# Patient Record
Sex: Male | Born: 1990 | Race: Black or African American | Hispanic: No | Marital: Single | Smoking: Former smoker
Health system: Southern US, Community
[De-identification: ages and names within clinical notes are randomized; demographics above are authoritative.]

## PROBLEM LIST (undated history)

## (undated) DIAGNOSIS — Z789 Other specified health status: Secondary | ICD-10-CM

## (undated) DIAGNOSIS — R002 Palpitations: Secondary | ICD-10-CM

## (undated) HISTORY — DX: Other specified health status: Z78.9

## (undated) HISTORY — DX: Palpitations: R00.2

## (undated) HISTORY — PX: NO PAST SURGERIES: SHX2092

---

## 2020-07-02 ENCOUNTER — Encounter (HOSPITAL_COMMUNITY): Payer: Self-pay

## 2020-07-02 ENCOUNTER — Emergency Department (HOSPITAL_COMMUNITY)
Admission: EM | Admit: 2020-07-02 | Discharge: 2020-07-02 | Disposition: A | Discharge status: Home / Self Care | Payer: Self-pay | Attending: Emergency Medicine | Admitting: Emergency Medicine

## 2020-07-02 ENCOUNTER — Other Ambulatory Visit: Payer: Self-pay

## 2020-07-02 DIAGNOSIS — M5459 Other low back pain: Secondary | ICD-10-CM | POA: Insufficient documentation

## 2020-07-02 DIAGNOSIS — Z87891 Personal history of nicotine dependence: Secondary | ICD-10-CM | POA: Insufficient documentation

## 2020-07-02 DIAGNOSIS — M545 Low back pain, unspecified: Secondary | ICD-10-CM

## 2020-07-02 LAB — URINALYSIS, ROUTINE W REFLEX MICROSCOPIC
Bilirubin Urine: NEGATIVE
Glucose, UA: NEGATIVE mg/dL
Hgb urine dipstick: NEGATIVE
Ketones, ur: NEGATIVE mg/dL
Leukocytes,Ua: NEGATIVE
Nitrite: NEGATIVE
Protein, ur: NEGATIVE mg/dL
Specific Gravity, Urine: 1.002 — ABNORMAL LOW (ref 1.005–1.030)
pH: 6 (ref 5.0–8.0)

## 2020-07-02 MED ORDER — METHOCARBAMOL 500 MG PO TABS: 500.0000 mg | ORAL_TABLET | Freq: Two times a day (BID) | ORAL | 0 refills | Status: DC

## 2020-07-02 MED ORDER — NAPROXEN 250 MG PO TABS
500.0000 mg | ORAL_TABLET | Freq: Once | ORAL | Status: AC
  Administered 2020-07-02: 500 mg via ORAL
  Filled 2020-07-02: qty 2

## 2020-07-02 MED ORDER — LIDOCAINE 5 % EX PTCH
1.0000 | MEDICATED_PATCH | CUTANEOUS | Status: DC
  Administered 2020-07-02: 1 via TRANSDERMAL
  Filled 2020-07-02: qty 1

## 2020-07-02 MED ORDER — NAPROXEN 500 MG PO TABS: 500.0000 mg | ORAL_TABLET | Freq: Two times a day (BID) | ORAL | 0 refills | Status: DC

## 2020-07-02 NOTE — ED Triage Notes (Signed)
Pt arrives via POV c/o on-going back pain on the lower right side that recently has gotten worse. Pt reports thinking he might have a kidney pain but can't put a clear description on the pain.

## 2020-07-02 NOTE — Discharge Instructions (Addendum)
As discussed, your low back pain is likely a muscle strain.  I am sending home with a pain medication and muscle relaxer.  Muscle relaxer can cause drowsiness do not drive or operate machinery while on the medication.  I have also included low back exercises.  You may also purchase over-the-counter Lidoderm patches and Voltaren gel for added pain relief.  Return to the ER for new or worsening symptoms.

## 2020-07-02 NOTE — ED Provider Notes (Signed)
[facility] EMERGENCY DEPARTMENT Provider Note   CSN: [id number] Arrival date & time: 07/02/20  2008     History Chief Complaint  Patient presents with  . Back Pain    Franklin Avila is a 30 y.o. male with no significant past medical history presents to the ED due to worsening right lower back pain that has been intermittent in nature for numerous years.  Most recent episode started earlier this morning.  Patient states he has been exercising more than normal.  Denies injury to low back.  Pain is worse with movement and palpation.  Pain does not radiate down right lower extremity.  Denies saddle paresthesias, bowel/bladder incontinence, lower extremity numbness/tingling, lower extremity weakness, fever, history of IV drug use, history of cancer.  Denies associated abdominal pain, urinary symptoms, and rash.  He took an ibuprofen prior to arrival with resolution in symptoms.  History obtained from patient and past medical records. No interpreter used during encounter.      History reviewed. No pertinent past medical history.  There are no problems to display for this patient.   History reviewed. No pertinent surgical history.     Family History  Problem Relation Age of Onset  . Asthma Mother     Social History   Tobacco Use  . Smoking status: Former Smoker    Years: 2.00  . Smokeless tobacco: Never Used  Vaping Use  . Vaping Use: Some days  Substance Use Topics  . Alcohol use: Yes  . Drug use: Not Currently    Types: Marijuana    Home Medications Prior to Admission medications   Medication Sig Start Date End Date Taking? Authorizing Provider  methocarbamol (ROBAXIN) 500 MG tablet Take 1 tablet (500 mg total) by mouth 2 (two) times daily. 07/02/20  Yes Aberman, Caroline C, PA-C  naproxen (NAPROSYN) 500 MG tablet Take 1 tablet (500 mg total) by mouth 2 (two) times daily. 07/02/20  Yes Mannie Stabile, PA-C    Allergies    Copper  Review of Systems   Review of  Systems  Constitutional: Negative for fever.  Gastrointestinal: Negative for abdominal pain.  Genitourinary: Negative for difficulty urinating and dysuria.  Musculoskeletal: Positive for back pain. Negative for gait problem.  All other systems reviewed and are negative.   Physical Exam Updated Vital Signs BP 123/76 (BP Location: Right Arm)   Pulse 77   Temp 98.1 F (36.7 C) (Oral)   Resp 16   Ht 5\' 7"  (1.702 m)   Wt 65.8 kg   SpO2 99%   BMI 22.71 kg/m   Physical Exam Vitals and nursing note reviewed.  Constitutional:      General: He is not in acute distress.    Appearance: He is not ill-appearing.  HENT:     Head: Normocephalic.  Eyes:     Pupils: Pupils are equal, round, and reactive to light.  Cardiovascular:     Rate and Rhythm: Normal rate and regular rhythm.     Pulses: Normal pulses.     Heart sounds: Normal heart sounds. No murmur heard. No friction rub. No gallop.   Pulmonary:     Effort: Pulmonary effort is normal.     Breath sounds: Normal breath sounds.  Abdominal:     General: Abdomen is flat. Bowel sounds are normal. There is no distension.     Palpations: Abdomen is soft.     Tenderness: There is no abdominal tenderness. There is no guarding or rebound.  Comments: Abdomen soft, nondistended, nontender to palpation in all quadrants without guarding or peritoneal signs. No rebound.   Musculoskeletal:     Cervical back: Neck supple.     Comments: No T-spine and L-spine midline tenderness, no stepoff or deformity, no paraspinal tenderness. No leg edema bilaterally Patient moves all extremities without difficulty. DP/PT pulses 2+ and equal bilaterally Sensation grossly intact bilaterally Strength of knee flexion and extension is 5/5 Plantar and dorsiflexion of ankle 5/5 Able to ambulate without difficulty  Skin:    Comments: No overlying rash  Neurological:     General: No focal deficit present.     Mental Status: He is alert.  Psychiatric:         Mood and Affect: Mood normal.        Behavior: Behavior normal.     ED Results / Procedures / Treatments   Labs (all labs ordered are listed, but only abnormal results are displayed) Labs Reviewed  URINALYSIS, ROUTINE W REFLEX MICROSCOPIC    EKG None  Radiology No results found.  Procedures Procedures   Medications Ordered in ED Medications  lidocaine (LIDODERM) 5 % 1 patch (has no administration in time range)  naproxen (NAPROSYN) tablet 500 mg (has no administration in time range)    ED Course  I have reviewed the triage vital signs and the nursing notes.  Pertinent labs & imaging results that were available during my care of the patient were reviewed by me and considered in my medical decision making (see chart for details).    MDM Rules/Calculators/A&P                         30 year old male presents to the ED due to right-sided lumbar back pain that has been intermittent in nature for numerous years.  Most recent episode started earlier today.  Denies direct injury.  Denies saddle paresthesias, bowel/bladder incontinence, lower extremity numbness/tingling, lower extremity weakness, fever, IV drug use, and history of cancer.  Vitals all within normal limits.  Patient is afebrile, not tachycardic or hypoxic.  Patient in no acute distress and non-ill-appearing.  No thoracic or lumbar midline tenderness.  No overlying rash to suggest shingles.  Patient able to ambulate in the ED without difficulty.  Abdomen soft, nondistended, nontender. Patient requesting urine sample to rule out infection and to check renal function. Low suspicion for renal etiology given location of pain.  Naproxen and Lidoderm patch provided for symptomatic relief.  Broad differential for back pain considered includes malignancy, disc herniation, spinal epidural abscess, spinal fracture, cauda equina, pyelonephritis, kidney stone, AAA, AD, pancreatitis, PE and PTX.   History without red flags (cancer,  IVDU, weakness, saddle anesthesia, trauma, weight loss) and physical exam most consistent with muscular strain. Doubt cauda equina or disc herniation due to lack of saddle anesthesia/bowel or bladder incontinence or urinary retention, normal gait and reassuring physical examination without neurologic deficits.  UA negative for hematuria or signs of infection. History is not supportive of kidney stone, AAA, AD, pancreatitis, PE or PTX. Patient has no CVA tenderness or urinary symptoms to suggest pyelonephritis or kidney stone.   Will manage patient conservatively at this time. NSAIDs, back exercises/stretches, heat therapy and follow up with PCP if symptoms do not resolve in 3-4 weeks. Patient offered muscle relaxer for comfort at night. Counseled on need to return to ED for fever, worsening or concerning symptoms. Strict ED precautions discussed with patient. Patient states understanding and agrees to plan.  Patient discharged home in no acute distress and stable vitals. Final Clinical Impression(s) / ED Diagnoses Final diagnoses:  Acute right-sided low back pain without sciatica    Rx / DC Orders ED Discharge Orders         Ordered    naproxen (NAPROSYN) 500 MG tablet  2 times daily        07/02/20 2045    methocarbamol (ROBAXIN) 500 MG tablet  2 times daily        07/02/20 2045           Jesusita Oka 07/02/20 2143    Cheryll Cockayne, MD 07/03/20 [phone]

## 2020-07-22 ENCOUNTER — Other Ambulatory Visit: Payer: Self-pay

## 2020-07-22 ENCOUNTER — Emergency Department (HOSPITAL_COMMUNITY): Payer: Medicaid - Out of State

## 2020-07-22 ENCOUNTER — Emergency Department (HOSPITAL_COMMUNITY)
Admission: EM | Admit: 2020-07-22 | Discharge: 2020-07-22 | Disposition: A | Discharge status: Home / Self Care | Payer: Medicaid - Out of State | Attending: Emergency Medicine | Admitting: Emergency Medicine

## 2020-07-22 ENCOUNTER — Encounter (HOSPITAL_COMMUNITY): Payer: Self-pay | Admitting: *Deleted

## 2020-07-22 DIAGNOSIS — S29012A Strain of muscle and tendon of back wall of thorax, initial encounter: Secondary | ICD-10-CM

## 2020-07-22 DIAGNOSIS — M546 Pain in thoracic spine: Secondary | ICD-10-CM | POA: Diagnosis present

## 2020-07-22 DIAGNOSIS — R059 Cough, unspecified: Secondary | ICD-10-CM | POA: Diagnosis not present

## 2020-07-22 DIAGNOSIS — M62830 Muscle spasm of back: Secondary | ICD-10-CM | POA: Insufficient documentation

## 2020-07-22 DIAGNOSIS — Z87891 Personal history of nicotine dependence: Secondary | ICD-10-CM | POA: Diagnosis not present

## 2020-07-22 NOTE — ED Triage Notes (Signed)
States he has pain in mid back and when he reads up on pain he has become concerned. He is here for further evaluation. Advised of wait.

## 2020-07-22 NOTE — Discharge Instructions (Addendum)
YOur xray showed no abnormalities   Get help right away if you: Have shortness of breath. Have chest pain. Develop numbness or weakness in your legs or arms. Have involuntary loss of urine (urinary incontinence).

## 2020-07-22 NOTE — ED Provider Notes (Signed)
[facility] EMERGENCY DEPARTMENT Provider Note   CSN: [id number] Arrival date & time: 07/22/20  1309     History Chief Complaint  Patient presents with  . Back Pain    Franklin Avila is a 30 y.o. male. Who presents emergency department chief complaint of mid back pain.  Patient states that has been going on for several days.  Worse when he raises his left arm.  He has had increased exercise routine.  Patient states that he used to drink and smoke but recently quit and is afraid that there may be something wrong inside of his chest.  He has a slight cough.  He denies fevers chills weight loss soaking night sweats. HPI     History reviewed. No pertinent past medical history.  There are no problems to display for this patient.   History reviewed. No pertinent surgical history.     Family History  Problem Relation Age of Onset  . Asthma Mother     Social History   Tobacco Use  . Smoking status: Former Smoker    Years: 2.00  . Smokeless tobacco: Never Used  Vaping Use  . Vaping Use: Some days  Substance Use Topics  . Alcohol use: Yes  . Drug use: Not Currently    Types: Marijuana    Home Medications Prior to Admission medications   Medication Sig Start Date End Date Taking? Authorizing Provider  methocarbamol (ROBAXIN) 500 MG tablet Take 1 tablet (500 mg total) by mouth 2 (two) times daily. 07/02/20   Mannie Stabile, PA-C  naproxen (NAPROSYN) 500 MG tablet Take 1 tablet (500 mg total) by mouth 2 (two) times daily. 07/02/20   Mannie Stabile, PA-C    Allergies    Copper  Review of Systems   Review of Systems Ten systems reviewed and are negative for acute change, except as noted in the HPI.   Physical Exam Updated Vital Signs BP 123/75   Pulse 66   Temp 97.9 F (36.6 C) (Oral)   Resp 14   SpO2 100%   Physical Exam Vitals and nursing note reviewed.  Constitutional:      General: He is not in acute distress.    Appearance: He is well-developed  and well-nourished. He is not diaphoretic.  HENT:     Head: Normocephalic and atraumatic.  Eyes:     General: No scleral icterus.    Conjunctiva/sclera: Conjunctivae normal.  Cardiovascular:     Rate and Rhythm: Normal rate and regular rhythm.     Heart sounds: Normal heart sounds.  Pulmonary:     Effort: Pulmonary effort is normal. No respiratory distress.     Breath sounds: Normal breath sounds.  Abdominal:     Palpations: Abdomen is soft.     Tenderness: There is no abdominal tenderness.  Musculoskeletal:        General: No edema.     Cervical back: Normal range of motion and neck supple.     Thoracic back: Spasms and tenderness present.       Back:  Skin:    General: Skin is warm and dry.  Neurological:     Mental Status: He is alert.  Psychiatric:        Behavior: Behavior normal.     ED Results / Procedures / Treatments   Labs (all labs ordered are listed, but only abnormal results are displayed) Labs Reviewed - No data to display  EKG None  Radiology No results found.  Procedures Procedures  Medications Ordered in ED Medications - No data to display  ED Course  I have reviewed the triage vital signs and the nursing notes.  Pertinent labs & imaging results that were available during my care of the patient were reviewed by me and considered in my medical decision making (see chart for details).    MDM Rules/Calculators/A&P                          I reviewed patient's chest x-ray which shows no abnormalities.  Patient appears to have muscular spasm in the lower trapezius fibers.  Patient be discharged with supportive care and over-the-counter pain medications.  Appears otherwise appropriate for discharge at this time.  Discussed return precautions Final Clinical Impression(s) / ED Diagnoses Final diagnoses:  None    Rx / DC Orders ED Discharge Orders    None       Arthor Captain, PA-C 07/22/20 2305    Maia Plan, MD 07/25/20 1023

## 2020-07-22 NOTE — ED Notes (Addendum)
Entered room and introduced self to patient. Pt appears to be resting in bed, respirations are even and unlabored with equal chest rise and fall. Bed is locked in the lowest position, side rails x2, call bell within reach. Pt educated on call light use and hourly rounding, verbalized understanding and in agreement at this time. All questions and concerns voiced addressed. Refreshments offered and provided per patient request.  

## 2020-07-22 NOTE — ED Triage Notes (Signed)
Back pain for over a month

## 2020-07-22 NOTE — ED Notes (Signed)
ED Provider at bedside. 

## 2020-10-10 ENCOUNTER — Other Ambulatory Visit: Payer: Self-pay

## 2020-10-10 ENCOUNTER — Encounter (HOSPITAL_COMMUNITY): Payer: Self-pay | Admitting: Emergency Medicine

## 2020-10-10 DIAGNOSIS — M5442 Lumbago with sciatica, left side: Secondary | ICD-10-CM | POA: Insufficient documentation

## 2020-10-10 DIAGNOSIS — M5441 Lumbago with sciatica, right side: Secondary | ICD-10-CM | POA: Insufficient documentation

## 2020-10-10 DIAGNOSIS — Z87891 Personal history of nicotine dependence: Secondary | ICD-10-CM | POA: Insufficient documentation

## 2020-10-10 DIAGNOSIS — R1110 Vomiting, unspecified: Secondary | ICD-10-CM | POA: Diagnosis not present

## 2020-10-10 DIAGNOSIS — M545 Low back pain, unspecified: Secondary | ICD-10-CM | POA: Diagnosis present

## 2020-10-10 NOTE — ED Triage Notes (Signed)
Pt c/o body aches and vomiting that started this am. Pt states he drank a lot of beer last night.

## 2020-10-11 ENCOUNTER — Emergency Department (HOSPITAL_COMMUNITY)
Admission: EM | Admit: 2020-10-11 | Discharge: 2020-10-11 | Disposition: A | Discharge status: Home / Self Care | Payer: Medicaid - Out of State | Attending: Emergency Medicine | Admitting: Emergency Medicine

## 2020-10-11 DIAGNOSIS — M5442 Lumbago with sciatica, left side: Secondary | ICD-10-CM

## 2020-10-11 DIAGNOSIS — M5441 Lumbago with sciatica, right side: Secondary | ICD-10-CM

## 2020-10-11 LAB — COMPREHENSIVE METABOLIC PANEL
ALT: 31 U/L (ref 0–44)
AST: 34 U/L (ref 15–41)
Albumin: 4.3 g/dL (ref 3.5–5.0)
Alkaline Phosphatase: 63 U/L (ref 38–126)
Anion gap: 10 (ref 5–15)
BUN: 10 mg/dL (ref 6–20)
CO2: 25 mmol/L (ref 22–32)
Calcium: 8.9 mg/dL (ref 8.9–10.3)
Chloride: 104 mmol/L (ref 98–111)
Creatinine, Ser: 1.01 mg/dL (ref 0.61–1.24)
GFR, Estimated: >60 mL/min (ref >60)
Glucose, Bld: 109 mg/dL — ABNORMAL HIGH (ref 70–99)
Potassium: 3.8 mmol/L (ref 3.5–5.1)
Sodium: 139 mmol/L (ref 135–145)
Total Bilirubin: 0.4 mg/dL (ref 0.3–1.2)
Total Protein: 7.4 g/dL (ref 6.5–8.1)

## 2020-10-11 LAB — CBC
HCT: 44.7 % (ref 39.0–52.0)
Hemoglobin: 14.5 g/dL (ref 13.0–17.0)
MCH: 29.1 pg (ref 26.0–34.0)
MCHC: 32.4 g/dL (ref 30.0–36.0)
MCV: 89.8 fL (ref 80.0–100.0)
Platelets: 178 10*3/uL (ref 150–400)
RBC: 4.98 MIL/uL (ref 4.22–5.81)
RDW: 14 % (ref 11.5–15.5)
WBC: 6.1 10*3/uL (ref 4.0–10.5)
nRBC: 0 % (ref 0.0–0.2)

## 2020-10-11 LAB — LIPASE, BLOOD: Lipase: 30 U/L (ref 11–51)

## 2020-10-11 MED ORDER — METHOCARBAMOL 1000 MG/10ML IJ SOLN
INTRAMUSCULAR | Status: AC
  Filled 2020-10-11: qty 10

## 2020-10-11 MED ORDER — KETOROLAC TROMETHAMINE 30 MG/ML IJ SOLN
30.0000 mg | Freq: Once | INTRAMUSCULAR | Status: AC
  Administered 2020-10-11: 30 mg via INTRAVENOUS
  Filled 2020-10-11: qty 1

## 2020-10-11 MED ORDER — METHOCARBAMOL 500 MG PO TABS: 500.0000 mg | ORAL_TABLET | Freq: Four times a day (QID) | ORAL | 0 refills | Status: DC | PRN

## 2020-10-11 MED ORDER — NAPROXEN 500 MG PO TABS: 500.0000 mg | ORAL_TABLET | Freq: Two times a day (BID) | ORAL | 0 refills | Status: DC

## 2020-10-11 MED ORDER — METHOCARBAMOL 1000 MG/10ML IJ SOLN
1000.0000 mg | Freq: Once | INTRAVENOUS | Status: AC
  Administered 2020-10-11: 1000 mg via INTRAVENOUS
  Filled 2020-10-11: qty 10

## 2020-10-11 MED ORDER — ACETAMINOPHEN 325 MG PO TABS
650.0000 mg | ORAL_TABLET | Freq: Once | ORAL | Status: AC
  Administered 2020-10-11: 650 mg via ORAL
  Filled 2020-10-11: qty 2

## 2020-10-11 NOTE — ED Provider Notes (Signed)
[facility] EMERGENCY DEPARTMENT Provider Note   CSN: [id number] Arrival date & time: 10/10/20  2324     History Chief Complaint  Patient presents with  . Emesis    Franklin Avila is a 30 y.o. male.  The history is provided by the patient.  Emesis 's complaint is actually low back pain with radiation to both legs.  He had been drinking last night and woke up this morning with nausea and vomiting.  He vomited once and felt better.  He denies nausea currently.  However, since then, he has had pain across his lower back radiating down both legs.  He rates pain at 9/10.  He has been unable to get comfortable.  He has taken ibuprofen without any relief.  He denies any weakness, numbness, tingling.  Denies any bowel or bladder dysfunction.  He has had back pain in the past.   History reviewed. No pertinent past medical history.  There are no problems to display for this patient.   History reviewed. No pertinent surgical history.     Family History  Problem Relation Age of Onset  . Asthma Mother     Social History   Tobacco Use  . Smoking status: Former Smoker    Years: 2.00  . Smokeless tobacco: Never Used  Vaping Use  . Vaping Use: Some days  Substance Use Topics  . Alcohol use: Yes  . Drug use: Yes    Home Medications Prior to Admission medications   Medication Sig Start Date End Date Taking? Authorizing Provider  methocarbamol (ROBAXIN) 500 MG tablet Take 1 tablet (500 mg total) by mouth 2 (two) times daily. 07/02/20   Mannie Stabile, PA-C  naproxen (NAPROSYN) 500 MG tablet Take 1 tablet (500 mg total) by mouth 2 (two) times daily. 07/02/20   Mannie Stabile, PA-C    Allergies    Copper  Review of Systems   Review of Systems  Gastrointestinal: Positive for vomiting.  All other systems reviewed and are negative.   Physical Exam Updated Vital Signs BP 117/72 (BP Location: Right Arm)   Pulse 96   Temp 99 F (37.2 C) (Oral)   Resp 17   Ht 5\' 6"   (1.676 m)   Wt 65.8 kg   SpO2 100%   BMI 23.40 kg/m   Physical Exam Vitals and nursing note reviewed.   30 year old male, resting comfortably and in no acute distress. Vital signs are normal. Oxygen saturation is 100%, which is normal. Head is normocephalic and atraumatic. PERRLA, EOMI. Oropharynx is clear. Neck is nontender and supple without adenopathy or JVD. Back: There is no midline tenderness.  Moderate bilateral paralumbar spasm is present.  Straight leg raise is positive at 60 degrees bilaterally.  There is no CVA tenderness. Lungs are clear without rales, wheezes, or rhonchi. Chest is nontender. Heart has regular rate and rhythm without murmur. Abdomen is soft, flat, nontender without masses or hepatosplenomegaly and peristalsis is normoactive. Extremities have no cyanosis or edema, full range of motion is present. Skin is warm and dry without rash. Neurologic: Mental status is normal, cranial nerves are intact, there are no motor or sensory deficits.  ED Results / Procedures / Treatments   Labs (all labs ordered are listed, but only abnormal results are displayed) Labs Reviewed  COMPREHENSIVE METABOLIC PANEL - Abnormal; Notable for the following components:      Result Value   Glucose, Bld 109 (*)    All other components within normal limits  LIPASE, BLOOD  CBC  URINALYSIS, ROUTINE W REFLEX MICROSCOPIC   Procedures Procedures   Medications Ordered in ED Medications  ketorolac (TORADOL) 30 MG/ML injection 30 mg (30 mg Intravenous Given 10/11/20 0250)  methocarbamol (ROBAXIN) 1,000 mg in dextrose 5 % 100 mL IVPB (0 mg Intravenous Stopped 10/11/20 0324)  acetaminophen (TYLENOL) tablet 650 mg (650 mg Oral Given 10/11/20 0250)    ED Course  I have reviewed the triage vital signs and the nursing notes.  Pertinent lab results that were available during my care of the patient were reviewed by me and considered in my medical decision making (see chart for details).   MDM  Rules/Calculators/A&P                         Low back pain with bilateral sciatica.  No evidence of any neurologic compromise.  No indication for imaging today.  Old records are reviewed, and he does have a prior ED visit for upper back pain.  We will give him a dose of ketorolac and methocarbamol and reassess.  Labs are unremarkable.  He feels much better after above-noted treatment.  He is discharged with prescriptions for naproxen and methocarbamol, told to add acetaminophen as needed for additional pain relief.  Final Clinical Impression(s) / ED Diagnoses Final diagnoses:  Acute bilateral low back pain with bilateral sciatica    Rx / DC Orders ED Discharge Orders         Ordered    methocarbamol (ROBAXIN) 500 MG tablet  Every 6 hours PRN        10/11/20 0352    naproxen (NAPROSYN) 500 MG tablet  2 times daily        10/11/20 0352           Dione Booze, MD 10/11/20 [phone]

## 2020-10-11 NOTE — Discharge Instructions (Addendum)
Apply ice to your lower back as needed.  Ice should be applied for 30 minutes at a time, as often as 4 times a day.  You may take acetaminophen (Tylenol) as needed for additional pain relief.

## 2022-04-10 IMAGING — DX DG CHEST 2V
2 series · 2 of 2 positions shown · non-contrast
Comparison: None.

CLINICAL DATA: Cough and mid back pain for 1 month

EXAM:
CHEST - 2 VIEW

[chest pa]
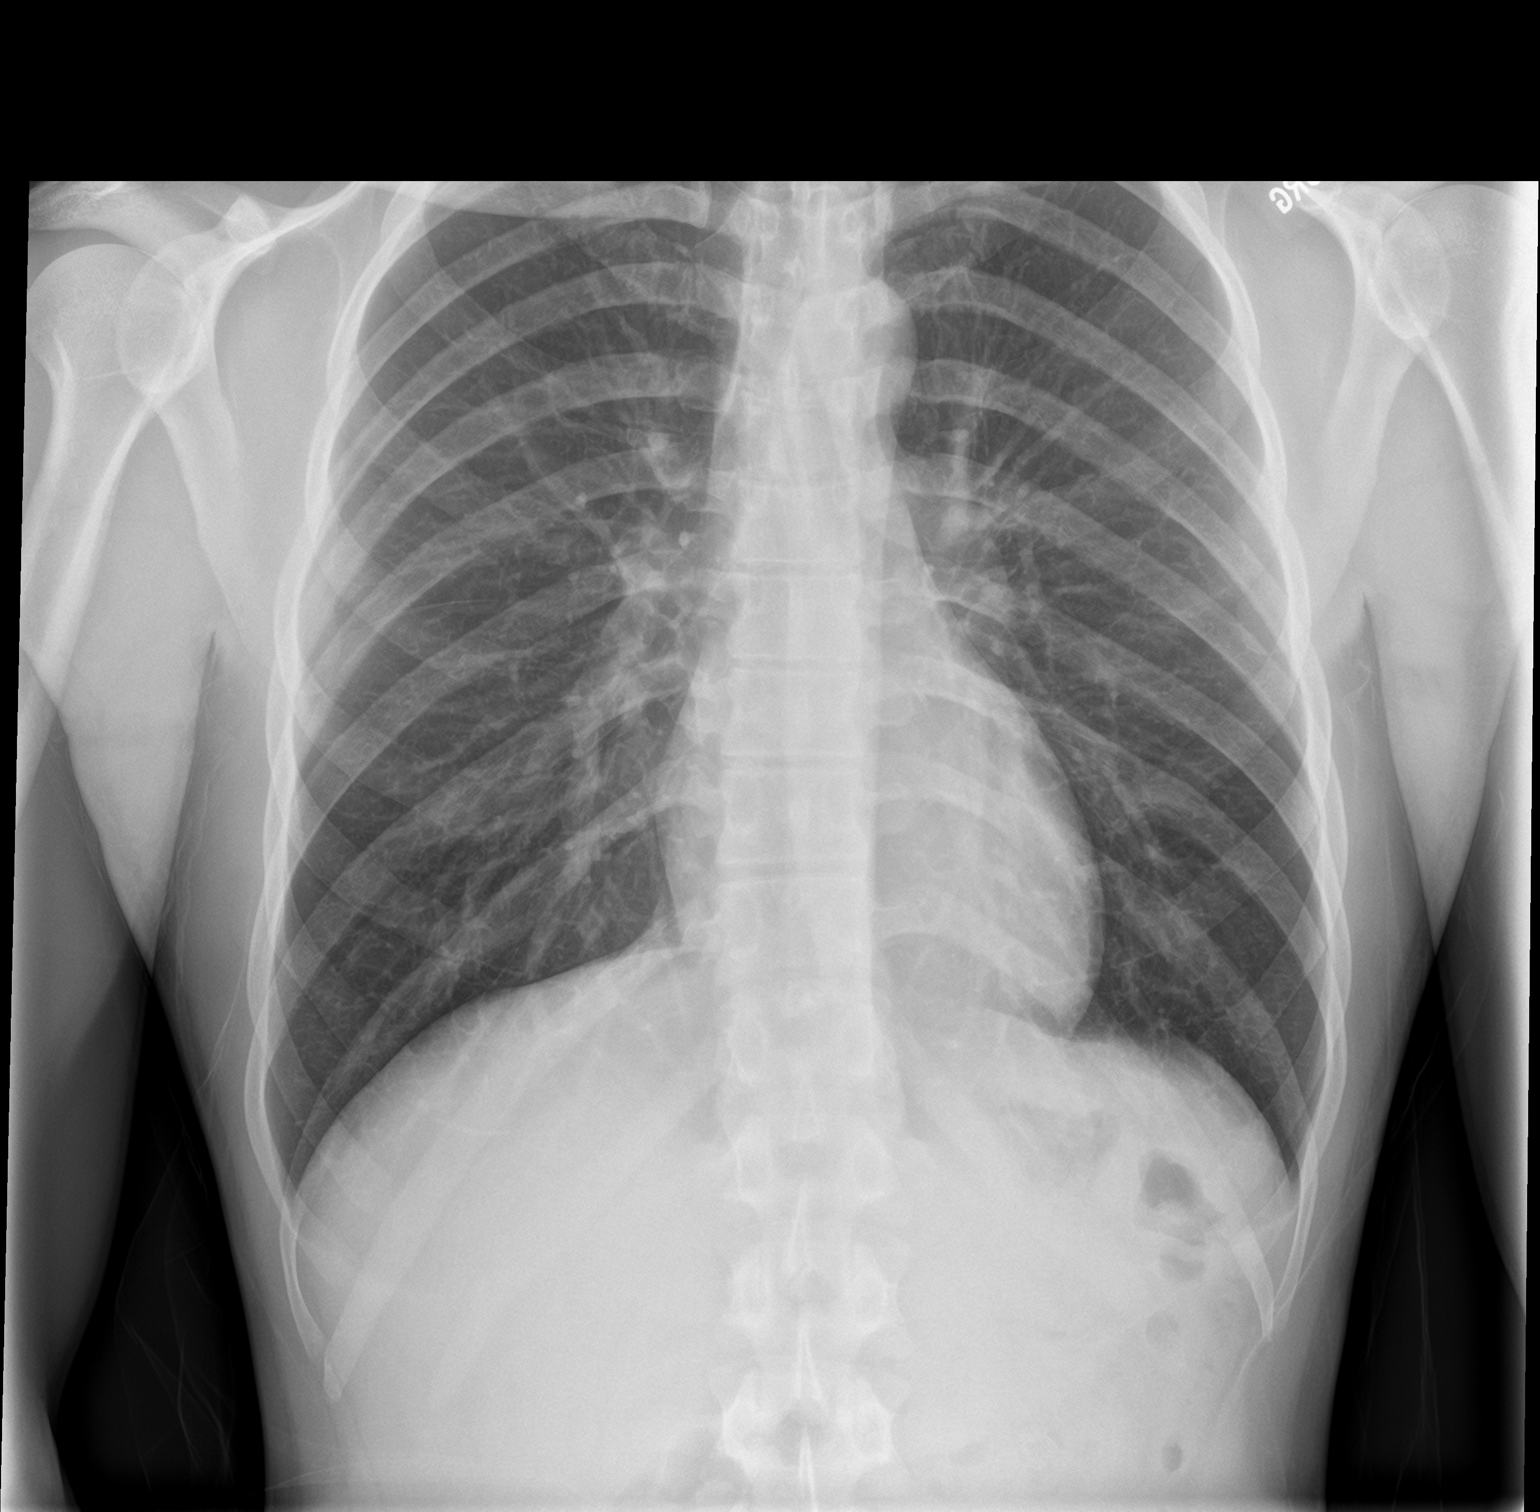

[chest lat]
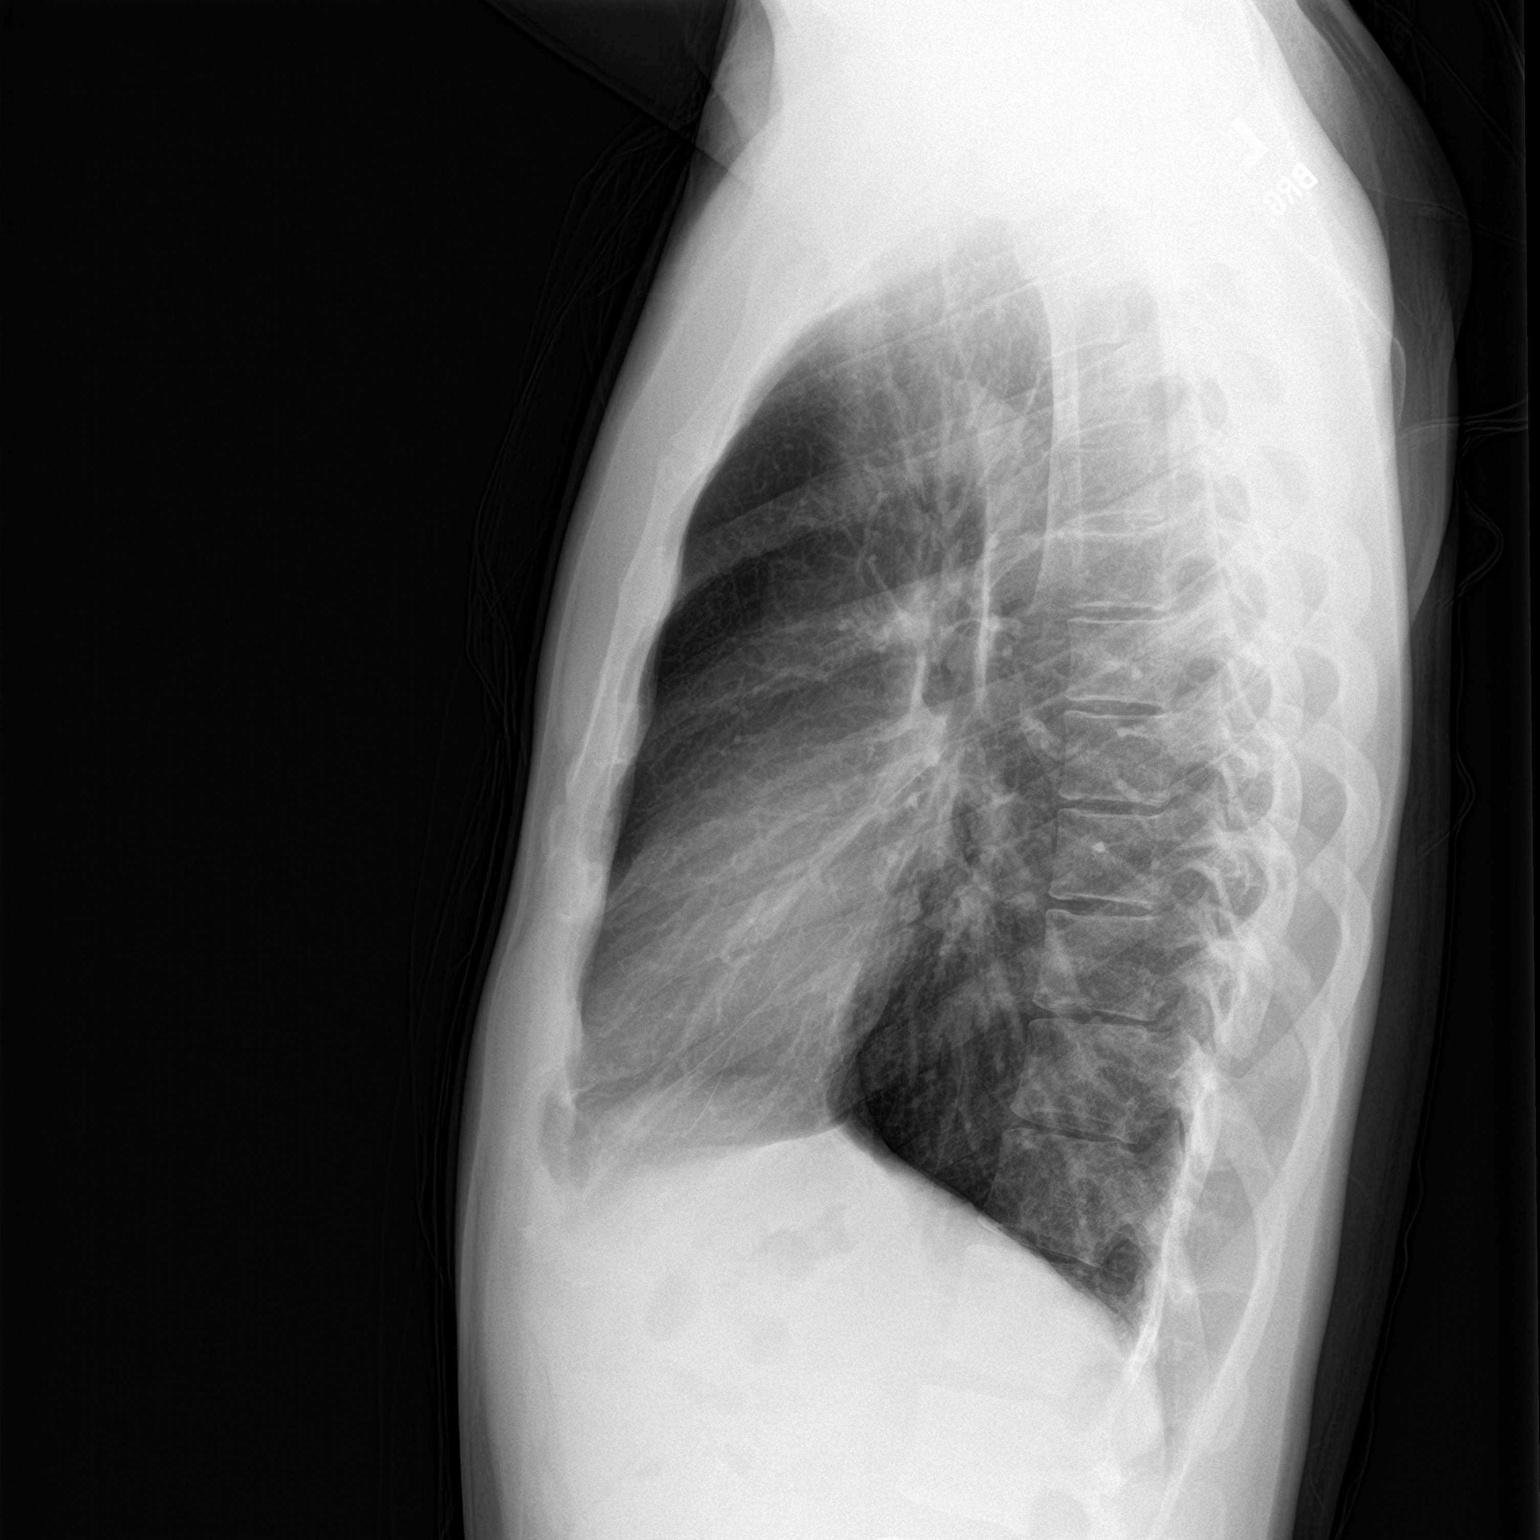

[2 of 2 positions shown; findings below may reference images not displayed]

FINDINGS: Normal heart size. Normal mediastinal contour. No pneumothorax. No
pleural effusion. Lungs appear clear, with no acute consolidative
airspace disease and no pulmonary edema.
IMPRESSION: No active cardiopulmonary disease.

## 2022-12-07 ENCOUNTER — Encounter (HOSPITAL_COMMUNITY): Payer: Self-pay | Admitting: Emergency Medicine

## 2022-12-07 ENCOUNTER — Other Ambulatory Visit: Payer: Self-pay

## 2022-12-07 ENCOUNTER — Emergency Department (HOSPITAL_COMMUNITY)
Admission: EM | Admit: 2022-12-07 | Discharge: 2022-12-08 | Disposition: A | Discharge status: Home / Self Care | Payer: Medicaid Other | Attending: Emergency Medicine | Admitting: Emergency Medicine

## 2022-12-07 ENCOUNTER — Emergency Department (HOSPITAL_COMMUNITY): Payer: Medicaid Other

## 2022-12-07 DIAGNOSIS — R0789 Other chest pain: Secondary | ICD-10-CM | POA: Diagnosis present

## 2022-12-07 DIAGNOSIS — F17200 Nicotine dependence, unspecified, uncomplicated: Secondary | ICD-10-CM | POA: Diagnosis not present

## 2022-12-07 LAB — CBC
HCT: 44.8 % (ref 39.0–52.0)
Hemoglobin: 14.9 g/dL (ref 13.0–17.0)
MCH: 29.3 pg (ref 26.0–34.0)
MCHC: 33.3 g/dL (ref 30.0–36.0)
MCV: 88 fL (ref 80.0–100.0)
Platelets: 218 10*3/uL (ref 150–400)
RBC: 5.09 MIL/uL (ref 4.22–5.81)
RDW: 13 % (ref 11.5–15.5)
WBC: 5.9 10*3/uL (ref 4.0–10.5)
nRBC: 0 % (ref 0.0–0.2)

## 2022-12-07 NOTE — ED Triage Notes (Signed)
Pt c/o slight chest soreness for the past 2 days. Pt also c/o slight shortness of breath this morning when he woke up, pt states he took some CBD oil and the shortness of breath improved.

## 2022-12-08 LAB — BASIC METABOLIC PANEL
Anion gap: 7 (ref 5–15)
BUN: 11 mg/dL (ref 6–20)
CO2: 25 mmol/L (ref 22–32)
Calcium: 8.9 mg/dL (ref 8.9–10.3)
Chloride: 103 mmol/L (ref 98–111)
Creatinine, Ser: 0.87 mg/dL (ref 0.61–1.24)
GFR, Estimated: >60 mL/min (ref >60)
Glucose, Bld: 94 mg/dL (ref 70–99)
Potassium: 3.9 mmol/L (ref 3.5–5.1)
Sodium: 135 mmol/L (ref 135–145)

## 2022-12-08 LAB — TROPONIN I (HIGH SENSITIVITY): Troponin I (High Sensitivity): 3 ng/L (ref <18)

## 2022-12-08 MED ORDER — IBUPROFEN 800 MG PO TABS
800.0000 mg | ORAL_TABLET | Freq: Once | ORAL | Status: AC
  Administered 2022-12-08: 800 mg via ORAL
  Filled 2022-12-08: qty 1

## 2022-12-08 NOTE — ED Provider Notes (Signed)
[place] EMERGENCY DEPARTMENT AT [facility] Provider Note   CSN: [id number] Arrival date & time: 12/07/22  2248     History  Chief Complaint  Patient presents with   Chest Pain    Franklin Avila is a 32 y.o. male.  Patient is a 33 year old male with no significant past medical history.  Patient presenting today with complaints of chest discomfort.  This is been ongoing for the past 2 days.  He describes a sharp pain to the lower front of his chest and that is worse when he sits forward and turns over.  He denies any shortness of breath, nausea, diaphoresis, or radiation to the arm or jaw.  He denies any recent exertional symptoms.  Only risk factor is his tobacco use.  The history is provided by the patient.       Home Medications Prior to Admission medications   Medication Sig Start Date End Date Taking? Authorizing Provider  methocarbamol (ROBAXIN) 500 MG tablet Take 1 tablet (500 mg total) by mouth every 6 (six) hours as needed for muscle spasms. 10/11/20   Dione Booze, MD  naproxen (NAPROSYN) 500 MG tablet Take 1 tablet (500 mg total) by mouth 2 (two) times daily. 10/11/20   Dione Booze, MD      Allergies    Copper    Review of Systems   Review of Systems  All other systems reviewed and are negative.   Physical Exam Updated Vital Signs BP (!) 125/95   Pulse 89   Temp 97.9 F (36.6 C) (Oral)   Resp 20   Ht 5\' 6"  (1.676 m)   Wt 65.8 kg   SpO2 94%   BMI 23.41 kg/m  Physical Exam Vitals and nursing note reviewed.  Constitutional:      General: He is not in acute distress.    Appearance: He is well-developed. He is not diaphoretic.  HENT:     Head: Normocephalic and atraumatic.  Cardiovascular:     Rate and Rhythm: Normal rate and regular rhythm.     Heart sounds: No murmur heard.    No friction rub.  Pulmonary:     Effort: Pulmonary effort is normal. No respiratory distress.     Breath sounds: Normal breath sounds. No wheezing or rales.   Abdominal:     General: Bowel sounds are normal. There is no distension.     Palpations: Abdomen is soft.     Tenderness: There is no abdominal tenderness.  Musculoskeletal:        General: Normal range of motion.     Cervical back: Normal range of motion and neck supple.     Right lower leg: No tenderness. No edema.     Left lower leg: No tenderness. No edema.  Skin:    General: Skin is warm and dry.  Neurological:     Mental Status: He is alert and oriented to person, place, and time.     Coordination: Coordination normal.     ED Results / Procedures / Treatments   Labs (all labs ordered are listed, but only abnormal results are displayed) Labs Reviewed  CBC  BASIC METABOLIC PANEL  TROPONIN I (HIGH SENSITIVITY)    EKG EKG Interpretation Date/Time:  Friday December 07 2022 23:01:20 EDT Ventricular Rate:  67 PR Interval:  158 QRS Duration:  98 QT Interval:  359 QTC Calculation: 379 R Axis:   85  Text Interpretation: Sinus rhythm ST elev, probable normal early repol pattern Confirmed by  Geoffery Lyons ([id number]) on 12/07/2022 11:04:57 PM  Radiology DG Chest Port 1 View  Result Date: 12/07/2022 CLINICAL DATA:  Chest soreness for 2 days. EXAM: PORTABLE CHEST 1 VIEW COMPARISON:  July 22, 2020 FINDINGS: The heart size and mediastinal contours are within normal limits. Both lungs are clear. The visualized skeletal structures are unremarkable. IMPRESSION: No active disease. Electronically Signed   By: Aram Candela M.D.   On: 12/07/2022 23:40    Procedures Procedures    Medications Ordered in ED Medications - No data to display  ED Course/ Medical Decision Making/ A&P  Patient presenting here with complaints of chest discomfort as described in the HPI.  He arrives here with stable vital signs and with unremarkable physical exam.  Heart and lungs are clear, but there is some tenderness to palpation of the anterior chest wall which reproduces his symptoms.  Workup  initiated including CBC, metabolic panel, and troponin, all of which are unremarkable.  Chest x-ray is clear.  Patient presenting with atypical chest pain, negative workup, and symptoms that are reproducible with palpation of the anterior chest wall.  He has no risk factors except for cigarette smoking.  Patient to be treated for what I believed to be musculoskeletal pain with ibuprofen, rest, and follow-up as needed.  Final Clinical Impression(s) / ED Diagnoses Final diagnoses:  None    Rx / DC Orders ED Discharge Orders     None         Geoffery Lyons, MD 12/08/22 [phone]

## 2022-12-08 NOTE — Discharge Instructions (Signed)
Begin taking ibuprofen 600 mg every 6 hours as needed.  Rest.  Follow-up with primary doctor if not improving in the next week.

## 2023-11-04 ENCOUNTER — Emergency Department (HOSPITAL_COMMUNITY)
Admission: EM | Admit: 2023-11-04 | Discharge: 2023-11-05 | Disposition: A | Discharge status: Home / Self Care | Attending: Emergency Medicine | Admitting: Emergency Medicine

## 2023-11-04 ENCOUNTER — Other Ambulatory Visit: Payer: Self-pay

## 2023-11-04 ENCOUNTER — Encounter (HOSPITAL_COMMUNITY): Payer: Self-pay | Admitting: Emergency Medicine

## 2023-11-04 DIAGNOSIS — R002 Palpitations: Secondary | ICD-10-CM | POA: Diagnosis not present

## 2023-11-04 DIAGNOSIS — R42 Dizziness and giddiness: Secondary | ICD-10-CM | POA: Diagnosis present

## 2023-11-04 DIAGNOSIS — R55 Syncope and collapse: Secondary | ICD-10-CM | POA: Insufficient documentation

## 2023-11-04 LAB — COMPREHENSIVE METABOLIC PANEL WITH GFR
ALT: 17 U/L (ref 0–44)
AST: 20 U/L (ref 15–41)
Albumin: 4.3 g/dL (ref 3.5–5.0)
Alkaline Phosphatase: 68 U/L (ref 38–126)
Anion gap: 8 (ref 5–15)
BUN: 11 mg/dL (ref 6–20)
CO2: 26 mmol/L (ref 22–32)
Calcium: 9.2 mg/dL (ref 8.9–10.3)
Chloride: 104 mmol/L (ref 98–111)
Creatinine, Ser: 0.79 mg/dL (ref 0.61–1.24)
GFR, Estimated: >60 mL/min (ref >60)
Glucose, Bld: 92 mg/dL (ref 70–99)
Potassium: 3.8 mmol/L (ref 3.5–5.1)
Sodium: 138 mmol/L (ref 135–145)
Total Bilirubin: 0.6 mg/dL (ref 0.0–1.2)
Total Protein: 7.1 g/dL (ref 6.5–8.1)

## 2023-11-04 LAB — CBC WITH DIFFERENTIAL/PLATELET
Abs Immature Granulocytes: 0.01 10*3/uL (ref 0.00–0.07)
Basophils Absolute: 0.1 10*3/uL (ref 0.0–0.1)
Basophils Relative: 1 %
Eosinophils Absolute: 0.1 10*3/uL (ref 0.0–0.5)
Eosinophils Relative: 1 %
HCT: 45.5 % (ref 39.0–52.0)
Hemoglobin: 14.8 g/dL (ref 13.0–17.0)
Immature Granulocytes: 0 %
Lymphocytes Relative: 44 %
Lymphs Abs: 2.4 10*3/uL (ref 0.7–4.0)
MCH: 28.6 pg (ref 26.0–34.0)
MCHC: 32.5 g/dL (ref 30.0–36.0)
MCV: 87.8 fL (ref 80.0–100.0)
Monocytes Absolute: 0.5 10*3/uL (ref 0.1–1.0)
Monocytes Relative: 10 %
Neutro Abs: 2.3 10*3/uL (ref 1.7–7.7)
Neutrophils Relative %: 44 %
Platelets: 214 10*3/uL (ref 150–400)
RBC: 5.18 MIL/uL (ref 4.22–5.81)
RDW: 13 % (ref 11.5–15.5)
WBC: 5.3 10*3/uL (ref 4.0–10.5)
nRBC: 0 % (ref 0.0–0.2)

## 2023-11-04 LAB — MAGNESIUM: Magnesium: 2 mg/dL (ref 1.7–2.4)

## 2023-11-04 MED ORDER — LACTATED RINGERS IV BOLUS: 1000.0000 mL | Freq: Once | INTRAVENOUS | Status: DC

## 2023-11-04 NOTE — ED Notes (Signed)
 Pt given water 8oz cup x2 given

## 2023-11-04 NOTE — ED Triage Notes (Signed)
 Pt states he has been having heart palpitations for the last few days. States they had "calmed down" but then today he went to [facility] and when he walking around, he became dizzy and his palpitations came back. States he feels "jittery".

## 2023-11-04 NOTE — ED Provider Notes (Signed)
 [place] EMERGENCY DEPARTMENT AT [facility] Provider Note   CSN: [id number] Arrival date & time: 11/04/23  2236     History {Add pertinent medical, surgical, social history, OB history to HPI:1} Chief Complaint  Patient presents with   Palpitations   Dizziness    Franklin Avila is a 33 y.o. male.   Palpitations Associated symptoms: dizziness   Dizziness Associated symptoms: palpitations   Patient presents for near syncope.  He has no known chronic medical conditions.  He states that he will have intermittent palpitations.  These are described as very brief, lasting only a few seconds where he has an abnormal feeling in his chest.  When he experiences these, he does feel that eating improves it.  He has never seen a cardiologist before.  This evening, he went to [facility] to get some food.  While at [facility], he had recurrence of these brief episodes of palpitations in addition to dizziness and near syncope.  Your syncopal symptoms lasted approximately 10 minutes.  He is currently asymptomatic.  He states that he drinks a minimal amount, typically 1 beer per day.  He denies any illicit drug use other than marijuana vaping.     Home Medications Prior to Admission medications   Medication Sig Start Date End Date Taking? Authorizing Provider  methocarbamol  (ROBAXIN ) 500 MG tablet Take 1 tablet (500 mg total) by mouth every 6 (six) hours as needed for muscle spasms. 10/11/20   Alissa April, MD  naproxen  (NAPROSYN ) 500 MG tablet Take 1 tablet (500 mg total) by mouth 2 (two) times daily. 10/11/20   Alissa April, MD      Allergies    Copper    Review of Systems   Review of Systems  Cardiovascular:  Positive for palpitations.  Neurological:  Positive for dizziness.  All other systems reviewed and are negative.   Physical Exam Updated Vital Signs BP 138/83 (BP Location: Right Arm)   Pulse 67   Temp 98.3 F (36.8 C)   Resp 18   Ht 5\' 6"  (1.676 m)   Wt 66 kg   SpO2  100%   BMI 23.48 kg/m  Physical Exam Vitals and nursing note reviewed.  Constitutional:      General: He is not in acute distress.    Appearance: Normal appearance. He is well-developed. He is not ill-appearing, toxic-appearing or diaphoretic.  HENT:     Head: Normocephalic and atraumatic.     Right Ear: External ear normal.     Left Ear: External ear normal.     Nose: Nose normal.     Mouth/Throat:     Mouth: Mucous membranes are moist.  Eyes:     Extraocular Movements: Extraocular movements intact.     Conjunctiva/sclera: Conjunctivae normal.  Cardiovascular:     Rate and Rhythm: Normal rate and regular rhythm.  Pulmonary:     Effort: Pulmonary effort is normal. No respiratory distress.  Abdominal:     General: There is no distension.     Palpations: Abdomen is soft.  Musculoskeletal:        General: No swelling. Normal range of motion.     Cervical back: Normal range of motion and neck supple.  Skin:    General: Skin is warm and dry.     Capillary Refill: Capillary refill takes less than 2 seconds.     Coloration: Skin is not jaundiced or pale.  Neurological:     General: No focal deficit present.  Mental Status: He is alert and oriented to person, place, and time.     Cranial Nerves: No cranial nerve deficit.     Sensory: No sensory deficit.     Motor: No weakness.     Coordination: Coordination normal.  Psychiatric:        Mood and Affect: Mood normal.        Behavior: Behavior normal.     ED Results / Procedures / Treatments   Labs (all labs ordered are listed, but only abnormal results are displayed) Labs Reviewed  CBC WITH DIFFERENTIAL/PLATELET  COMPREHENSIVE METABOLIC PANEL WITH GFR  CBG MONITORING, ED    EKG None  Radiology No results found.  Procedures Procedures  {Document cardiac monitor, telemetry assessment procedure when appropriate:1}  Medications Ordered in ED Medications - No data to display  ED Course/ Medical Decision Making/  A&P   {   Click here for ABCD2, HEART and other calculatorsREFRESH Note before signing :1}                              Medical Decision Making Amount and/or Complexity of Data Reviewed Labs: ordered.   This patient presents to the ED for concern of ***, this involves an extensive number of treatment options, and is a complaint that carries with it a high risk of complications and morbidity.  The differential diagnosis includes ***   Co morbidities / Chronic conditions that complicate the patient evaluation  ***   Additional history obtained:  Additional history obtained from EMR External records from outside source obtained and reviewed including ***   Lab Tests:  I Ordered, and personally interpreted labs.  The pertinent results include:  ***   Imaging Studies ordered:  I ordered imaging studies including ***  I independently visualized and interpreted imaging which showed *** I agree with the radiologist interpretation   Cardiac Monitoring: / EKG:  The patient was maintained on a cardiac monitor.  I personally viewed and interpreted the cardiac monitored which showed an underlying rhythm of: ***   Problem List / ED Course / Critical interventions / Medication management  *** I ordered medication including ***   Reevaluation of the patient after these medicines showed that the patient *** I have reviewed the patients home medicines and have made adjustments as needed   Consultations Obtained:  I requested consultation with the ***,  and discussed lab and imaging findings as well as pertinent plan - they recommend: ***   Social Determinants of Health:  ***   Test / Admission - Considered:  ***   {Document critical care time when appropriate:1} {Document review of labs and clinical decision tools ie heart score, Chads2Vasc2 etc:1}  {Document your independent review of radiology images, and any outside records:1} {Document your discussion with family  members, caretakers, and with consultants:1} {Document social determinants of health affecting pt's care:1} {Document your decision making why or why not admission, treatments were needed:1} Final Clinical Impression(s) / ED Diagnoses Final diagnoses:  None    Rx / DC Orders ED Discharge Orders     None

## 2023-11-04 NOTE — ED Notes (Signed)
 ED Provider at bedside.

## 2023-11-05 NOTE — Discharge Instructions (Signed)
 Your test results today were reassuring.  During plenty fluids at home to stay hydrated.  A referral for cardiology follow-up was ordered.  You should hear from their office within the next few days.  If you do not, call the telephone number below.  Return to the emergency department for any new or worsening symptoms of concern.

## 2023-11-06 ENCOUNTER — Ambulatory Visit: Attending: Cardiology

## 2023-11-06 ENCOUNTER — Encounter: Payer: Self-pay | Admitting: Cardiology

## 2023-11-06 ENCOUNTER — Ambulatory Visit: Attending: Cardiology | Admitting: Cardiology

## 2023-11-06 VITALS — BP 124/88 | HR 68 | Ht 67.0 in | Wt 147.0 lb

## 2023-11-06 DIAGNOSIS — R002 Palpitations: Secondary | ICD-10-CM | POA: Insufficient documentation

## 2023-11-06 NOTE — Patient Instructions (Signed)
 Medication Instructions:   Your physician recommends that you continue on your current medications as directed. Please refer to the Current Medication list given to you today.    Labwork: None today  Testing/Procedures: ZIO XT- Long Term Monitor Instructions   Your physician has requested you wear your ZIO patch monitor___14____days.   This is a single patch monitor.  [employer] supplies one patch monitor per enrollment.  Additional stickers are not available.   Please do not apply patch if you will be having a Nuclear Stress Test, Echocardiogram, Cardiac CT, MRI, or Chest Xray during the time frame you would be wearing the monitor. The patch cannot be worn during these tests.  You cannot remove and re-apply the ZIO XT patch monitor.      Do not shower for the first 24 hours.  You may shower after the first 24 hours.   Press button if you feel a symptom. You will hear a small click.  Record Date, Time and Symptom in the Patient Log Book.   When you are ready to remove patch, follow instructions on last 2 pages of Patient Log Book.  Stick patch monitor onto last page of Patient Log Book.   Place Patient Log Book in [place] box.  Use locking tab on box and tape box closed securely.  The [employer] and [employer] has [employer] on it.  Please place in mailbox as soon as possible.  Your physician should have your test results approximately 7 days after the monitor has been mailed back to [facility].   Call [facility] Customer Care at [phone] if you have questions regarding your ZIO XT patch monitor.  Call them immediately if you see an orange light blinking on your monitor.   If your monitor falls off in less than 4 days contact our Monitor department at [phone].  If your monitor becomes loose or falls off after 4 days call [employer] at [phone] for suggestions on securing your monitor.    Follow-Up: 6 weeks  Any Other Special Instructions Will Be Listed Below (If  Applicable).  If you need a refill on your cardiac medications before your next appointment, please call your pharmacy.

## 2023-11-06 NOTE — Progress Notes (Signed)
      Clinical Summary Mr. Corkum is a 33 y.o.male seen today as a new consult, referred by Dr Alyson Jump for the following medical problems   1.Palpitations - symptoms on and off starting 6 months ago - random episodes - feeling of heart skipping, lasting a few beats but can be repetitive.   - on day of ER evaluation had increasing palpitations. Also reports very limited oral intake, did not eat or drink much that day.   walking to [employer], felt very lightheaded. Walking to register severely dizzy. Some palpitations at that time.  - was able to walk to car, friend drove him to ER.  - ER visit 11/04/23 with palpitations, dizziness - K 3.8,Mg 2. EKG NSR  - limited caffeine, occasional tea. No heavy EtOH           No past medical history on file.   Allergies  Allergen Reactions   Copper Rash     Current Outpatient Medications  Medication Sig Dispense Refill   methocarbamol  (ROBAXIN ) 500 MG tablet Take 1 tablet (500 mg total) by mouth every 6 (six) hours as needed for muscle spasms. 60 tablet 0   naproxen  (NAPROSYN ) 500 MG tablet Take 1 tablet (500 mg total) by mouth 2 (two) times daily. 30 tablet 0   No current facility-administered medications for this visit.     No past surgical history on file.   Allergies  Allergen Reactions   Copper Rash      Family History  Problem Relation Age of Onset   Asthma Mother      Social History Mr. Otterness reports that he has quit smoking. He has never used smokeless tobacco. Mr. Takagi reports current alcohol use.    Physical Examination Today's Vitals   11/06/23 1458  BP: 124/88  Pulse: 68  SpO2: 99%  Weight: 147 lb (66.7 kg)  Height: 5' 7 (1.702 m)   Body mass index is 23.02 kg/m.  Gen: resting comfortably, no acute distress HEENT: no scleral icterus, pupils equal round and reactive, no palptable cervical adenopathy,  CV: RRR, no m/rg, no jvd Resp: Clear to auscultation bilaterally GI: abdomen is  soft, non-tender, non-distended, normal bowel sounds, no hepatosplenomegaly MSK: extremities are warm, no edema.  Skin: warm, no rash Neuro:  no focal deficits Psych: appropriate affect     Assessment and Plan  1.Palpitations - baseline EKG shows NSR - will plan for 2 week zio patch to further evaluate      Laurann Pollock, M.D.

## 2023-12-18 ENCOUNTER — Encounter: Payer: Self-pay | Admitting: Student

## 2023-12-18 ENCOUNTER — Ambulatory Visit: Attending: Student | Admitting: Student

## 2023-12-18 ENCOUNTER — Telehealth: Payer: Self-pay | Admitting: *Deleted

## 2023-12-18 VITALS — BP 152/84 | HR 68 | Ht 67.0 in

## 2023-12-18 DIAGNOSIS — R002 Palpitations: Secondary | ICD-10-CM | POA: Insufficient documentation

## 2023-12-18 DIAGNOSIS — R030 Elevated blood-pressure reading, without diagnosis of hypertension: Secondary | ICD-10-CM | POA: Diagnosis present

## 2023-12-18 DIAGNOSIS — I493 Ventricular premature depolarization: Secondary | ICD-10-CM | POA: Insufficient documentation

## 2023-12-18 NOTE — Telephone Encounter (Signed)
  Patient Consent for Virtual Visit        Franklin Avila has provided verbal consent on 12/18/2023 for a virtual visit (video or telephone).   CONSENT FOR VIRTUAL VISIT FOR:  Franklin Avila  By participating in this virtual visit I agree to the following:  I hereby voluntarily request, consent and authorize [facility] and its employed or contracted physicians, physician assistants, nurse practitioners or other licensed health care professionals (the Practitioner), to provide me with telemedicine health care services (the "Services) as deemed necessary by the treating Practitioner. I acknowledge and consent to receive the Services by the Practitioner via telemedicine. I understand that the telemedicine visit will involve communicating with the Practitioner through live audiovisual communication technology and the disclosure of certain medical information by electronic transmission. I acknowledge that I have been given the opportunity to request an in-person assessment or other available alternative prior to the telemedicine visit and am voluntarily participating in the telemedicine visit.  I understand that I have the right to withhold or withdraw my consent to the use of telemedicine in the course of my care at any time, without affecting my right to future care or treatment, and that the Practitioner or I may terminate the telemedicine visit at any time. I understand that I have the right to inspect all information obtained and/or recorded in the course of the telemedicine visit and may receive copies of available information for a reasonable fee.  I understand that some of the potential risks of receiving the Services via telemedicine include:  Delay or interruption in medical evaluation due to technological equipment failure or disruption; Information transmitted may not be sufficient (e.g. poor resolution of images) to allow for appropriate medical decision making by the  Practitioner; and/or  In rare instances, security protocols could fail, causing a breach of personal health information.  Furthermore, I acknowledge that it is my responsibility to provide information about my medical history, conditions and care that is complete and accurate to the best of my ability. I acknowledge that Practitioner's advice, recommendations, and/or decision may be based on factors not within their control, such as incomplete or inaccurate data provided by me or distortions of diagnostic images or specimens that may result from electronic transmissions. I understand that the practice of medicine is not an exact science and that Practitioner makes no warranties or guarantees regarding treatment outcomes. I acknowledge that a copy of this consent can be made available to me via my patient portal [facility] MyChart), or I can request a printed copy by calling the office of [facility].    I understand that my insurance will be billed for this visit.   I have read or had this consent read to me. I understand the contents of this consent, which adequately explains the benefits and risks of the Services being provided via telemedicine.  I have been provided ample opportunity to ask questions regarding this consent and the Services and have had my questions answered to my satisfaction. I give my informed consent for the services to be provided through the use of telemedicine in my medical care

## 2023-12-18 NOTE — Patient Instructions (Signed)
 Medication Instructions:   Continue current medication regimen.  *If you need a refill on your cardiac medications before your next appointment, please call your pharmacy*  Follow-Up: At [facility], you and your health needs are our priority.  As part of our continuing mission to provide you with exceptional heart care, our providers are all part of one team.  This team includes your primary Cardiologist (physician) and Advanced Practice Providers or APPs (Physician Assistants and Nurse Practitioners) who all work together to provide you with the care you need, when you need it.  Your next appointment:   1 year(s)  Provider:   You may see Alvan Carrier, MD or one of the following Advanced Practice Providers on your designated Care Team:   Laymon Qua, PA-C  [place], [place] Olivia [place], [place]     We recommend signing up for the patient portal called MyChart.  Sign up information is provided on this After Visit Summary.  MyChart is used to connect with patients for Virtual Visits (Telemedicine).  Patients are able to view lab/test results, encounter notes, upcoming appointments, etc.  Non-urgent messages can be sent to your provider as well.   To learn more about what you can do with MyChart, go to [email or web address].

## 2023-12-18 NOTE — Progress Notes (Signed)
 Virtual Visit via Telephone Note   Because of Franklin Avila's co-morbid illnesses, he is at least at moderate risk for complications without adequate follow up.  This format is felt to be most appropriate for this patient at this time.  The patient did not have access to video technology/had technical difficulties with video requiring transitioning to audio format only (telephone).  All issues noted in this document were discussed and addressed.  No physical exam could be performed with this format.  Please refer to the patient's chart for his consent to telehealth for [facility].    Date:  12/18/2023   ID:  Franklin Avila, DOB 1990-12-18, MRN [id number] The patient was identified using 2 identifiers.  Patient Location: Other:  Work Provider Location: Office/Clinic  PCP:  Diedra Senior, MD   [facility] Providers Cardiologist:  Alvan Carrier, MD     Evaluation Performed:  Follow-Up Visit  History of Present Illness:    Franklin Avila is a 33 y.o. male with past medical history of palpitations and no significant past medical history who presents to the office today for 6-week follow-up.  He was last examined by Dr. Alvan on 11/06/2023 following recent ED evaluation for palpitations. Reported associated dizziness at that time. Labs during ED evaluation had shown normal electrolytes and hemoglobin. A 2-week Zio patch was recommended for further assessment. The preliminary report on this showed predominantly normal sinus rhythm with rare PAC's and PVC's occurring less than 1% of the time. No significant arrhythmias.  In talking with the patient today, he reports his palpitations have significantly improved since his last office visit and he denies any recent symptoms. States that he has made dietary changes as he was previously skipping meals and now consumes more frequent, smaller meals. He was also previously consuming Frappuccino's and quit consuming most  caffeine. Has been trying to focus on more water intake. Does not consume sodas and also stopped using marijuana. Was started on Vitamin D supplementation by his PCP. He is active at his job and denies any recent chest pain or dyspnea on exertion. No reported orthopnea or PND. Also tries to workout in the morning hours before going to work and no symptoms with this.    Past Medical History:  Diagnosis Date   Palpitations    Past Surgical History:  Procedure Laterality Date   NO PAST SURGERIES       Current Meds  Medication Sig   Vitamin D, Ergocalciferol, (DRISDOL) 1.25 MG (50000 UNIT) CAPS capsule Take 50,000 Units by mouth once a week.     Allergies:   Copper   Social History   Tobacco Use   Smoking status: Former    Types: Cigarettes   Smokeless tobacco: Never  Vaping Use   Vaping status: Former  Substance Use Topics   Alcohol use: Yes    Comment: occ   Drug use: Yes     Family Hx: The patient's family history includes Arthritis in his father; Asthma in his mother; COPD in his mother; Cancer in his maternal grandfather; Cataracts in his father; Kidney disease in his mother.  ROS:   Please see the history of present illness.     All other systems reviewed and are negative.   Prior CV studies:   The following studies were reviewed today:   Event Monitor: 11/2023 Patch Wear Time:  13 days and 0 hours (2025-06-11T15:50:05-398 to 2025-06-24T16:41:59-0400)   Patient had a min HR of 36 bpm, max  HR of 137 bpm, and avg HR of 64 bpm. Predominant underlying rhythm was Sinus Rhythm. Isolated SVEs were rare (<1.0%), and no SVE Couplets or SVE Triplets were present. Isolated VEs were rare (<1.0%), and no VE Couplets  or VE Triplets were present.   Labs/Other Tests and Data Reviewed:    EKG:  An ECG dated 11/04/2023 was personally reviewed today and demonstrated:  NSR, HR 67 with prominent T-waves but similar to prior tracings.   Recent Labs: 11/04/2023: ALT 17; BUN 11;  Creatinine, Ser 0.79; Hemoglobin 14.8; Magnesium 2.0; Platelets 214; Potassium 3.8; Sodium 138   Recent Lipid Panel No results found for: CHOL, TRIG, HDL, CHOLHDL, LDLCALC, LDLDIRECT  Wt Readings from Last 3 Encounters:  11/06/23 147 lb (66.7 kg)  11/04/23 145 lb 8.1 oz (66 kg)  12/07/22 145 lb 1 oz (65.8 kg)         Objective:    Vital Signs:  BP (!) 152/84   Pulse 68   Ht 5' 7 (1.702 m)   SpO2 96%   BMI 23.02 kg/m    General: Pleasant male sounding in NAD Psych: Normal affect. Neuro: Alert and oriented X 3.   ASSESSMENT & PLAN:    1. Palpitations/PVC's - Recent monitor as outlined above was overall reassuring with rare PAC's and PVC's but no significant arrhythmias. He reports symptoms have resolved with lifestyle modification and was encouraged to continue with this. - I encouraged him to make us  aware if he has recurrent symptoms as we could prescribe PRN Lopressor but will focus on lifestyle changes at this time.  2.  Elevated BP without diagnosis of HTN - BP was elevated at 152/84 when checked today but this was while at work and he had not been sitting for at least 5 minutes. Encouraged him to continue to follow this in the ambulatory setting and report back if elevated.  BP has overall been well-controlled at office visits and during recent ED evaluation.   Time:   Today, I have spent 14 minutes with the patient with telehealth technology discussing the above problems.     Medication Adjustments/Labs and Tests Ordered: Current medicines are reviewed at length with the patient today.  Concerns regarding medicines are outlined above.   Tests Ordered: No orders of the defined types were placed in this encounter.   Medication Changes: No orders of the defined types were placed in this encounter.   Follow Up:  In Person in 1 year(s)  Signed, Laymon CHRISTELLA Qua, PA-C  12/18/2023 4:15 PM    [facility]

## 2024-01-07 DIAGNOSIS — R002 Palpitations: Secondary | ICD-10-CM

## 2024-01-22 ENCOUNTER — Ambulatory Visit: Payer: Self-pay | Admitting: Cardiology
